# Patient Record
Sex: Female | Born: 1980 | Race: White | Hispanic: No | Marital: Single | Smoking: Never smoker
Health system: Southern US, Community
[De-identification: ages and names within clinical notes are randomized; demographics above are authoritative.]

## PROBLEM LIST (undated history)

## (undated) DIAGNOSIS — N809 Endometriosis, unspecified: Secondary | ICD-10-CM

## (undated) HISTORY — PX: WISDOM TOOTH EXTRACTION: SHX21

---
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
## 2003-09-17 ENCOUNTER — Emergency Department (HOSPITAL_COMMUNITY): Admission: AD | Admit: 2003-09-17 | Discharge: 2003-09-17 | Payer: Self-pay | Admitting: Family Medicine

## 2004-07-24 IMAGING — CR DG FOOT COMPLETE 3+V*L*
2 series · 2 of 2 positions shown · non-contrast
Comparison: none

CLINICAL DATA: Pain. 
 LEFT FOOT, THREE VIEWS ? 09/17/03 (9977 HOURS)
 There is no evidence of fracture or dislocation.  No other significant bone or soft tissue abnormalities are identified.  The joint spaces are within normal limits. 

 IMPRESSION
 Normal study.

[view not recorded (1 of 2)]
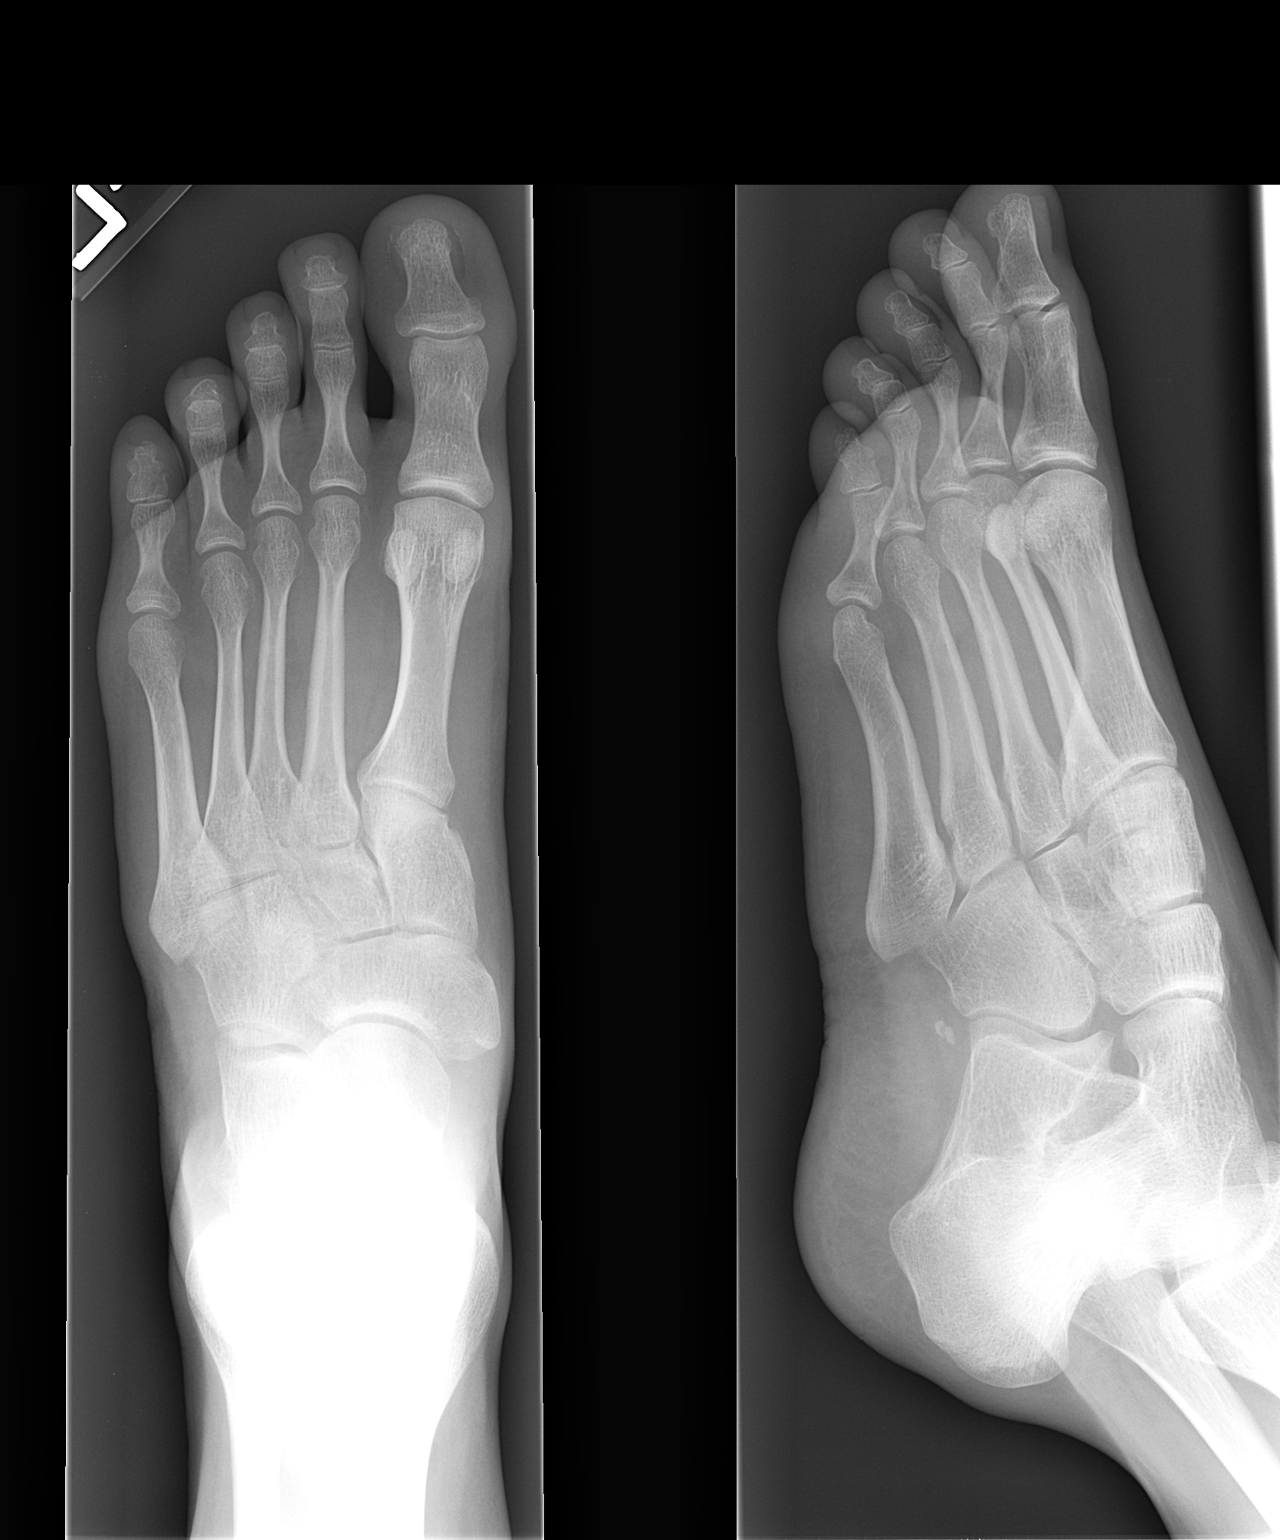

[view not recorded (2 of 2)]
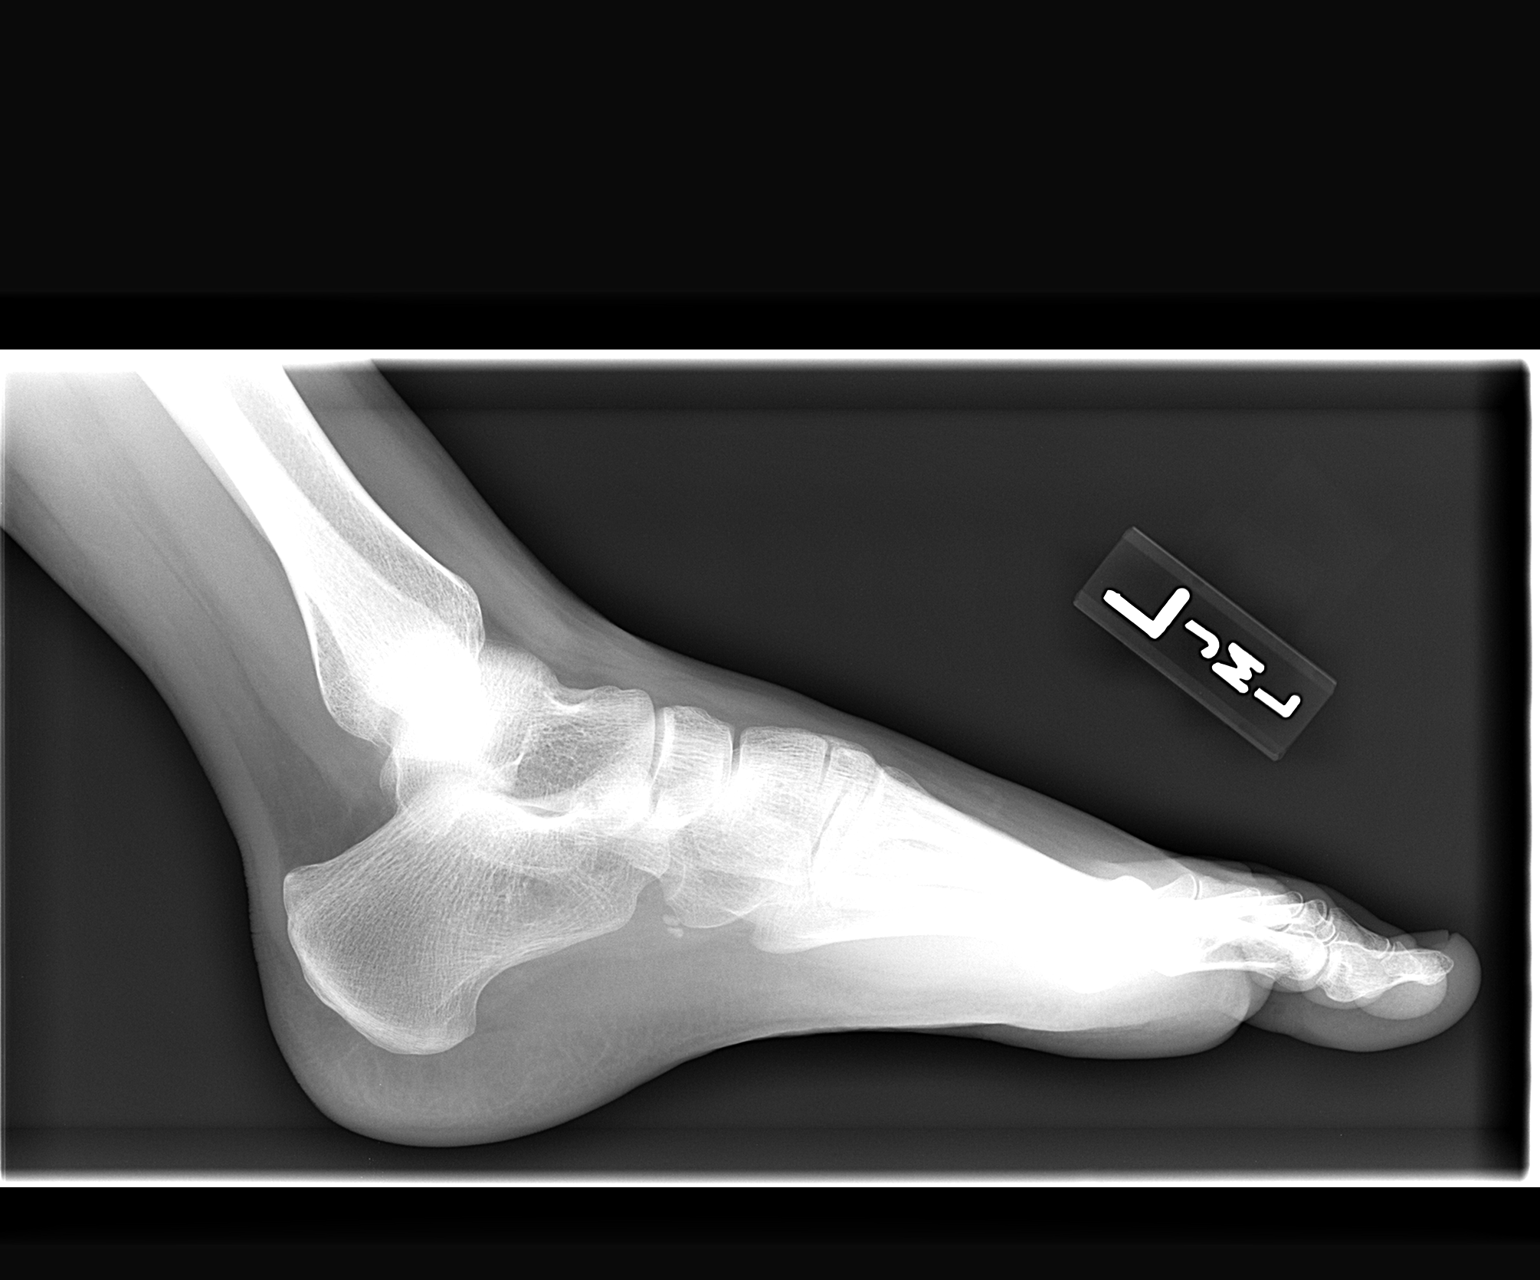

[2 of 2 positions shown; findings below may reference images not displayed]

## 2017-07-04 DIAGNOSIS — Z23 Encounter for immunization: Secondary | ICD-10-CM | POA: Diagnosis not present

## 2018-01-04 DIAGNOSIS — Z136 Encounter for screening for cardiovascular disorders: Secondary | ICD-10-CM | POA: Diagnosis not present

## 2018-01-04 DIAGNOSIS — Z0000 Encounter for general adult medical examination without abnormal findings: Secondary | ICD-10-CM | POA: Diagnosis not present

## 2018-01-04 DIAGNOSIS — R0981 Nasal congestion: Secondary | ICD-10-CM | POA: Diagnosis not present

## 2018-01-17 DIAGNOSIS — Z01419 Encounter for gynecological examination (general) (routine) without abnormal findings: Secondary | ICD-10-CM | POA: Diagnosis not present

## 2018-02-20 DIAGNOSIS — N83201 Unspecified ovarian cyst, right side: Secondary | ICD-10-CM | POA: Diagnosis not present

## 2018-02-20 DIAGNOSIS — N920 Excessive and frequent menstruation with regular cycle: Secondary | ICD-10-CM | POA: Diagnosis not present

## 2018-03-12 DIAGNOSIS — R638 Other symptoms and signs concerning food and fluid intake: Secondary | ICD-10-CM | POA: Diagnosis not present

## 2018-03-19 ENCOUNTER — Other Ambulatory Visit: Payer: Self-pay | Admitting: Obstetrics & Gynecology

## 2018-03-20 ENCOUNTER — Other Ambulatory Visit: Payer: Self-pay

## 2018-03-20 ENCOUNTER — Encounter (HOSPITAL_COMMUNITY): Payer: Self-pay | Admitting: *Deleted

## 2018-03-20 ENCOUNTER — Encounter (HOSPITAL_COMMUNITY)
Admission: RE | Admit: 2018-03-20 | Discharge: 2018-03-20 | Disposition: A | Discharge status: Home / Self Care | Payer: 59 | Source: Ambulatory Visit | Attending: Obstetrics & Gynecology | Admitting: Obstetrics & Gynecology

## 2018-03-20 DIAGNOSIS — N83201 Unspecified ovarian cyst, right side: Secondary | ICD-10-CM | POA: Diagnosis not present

## 2018-03-20 DIAGNOSIS — N83202 Unspecified ovarian cyst, left side: Secondary | ICD-10-CM | POA: Insufficient documentation

## 2018-03-20 DIAGNOSIS — N809 Endometriosis, unspecified: Secondary | ICD-10-CM | POA: Insufficient documentation

## 2018-03-20 DIAGNOSIS — Z01812 Encounter for preprocedural laboratory examination: Secondary | ICD-10-CM | POA: Diagnosis not present

## 2018-03-20 HISTORY — DX: Endometriosis, unspecified: N80.9

## 2018-03-20 LAB — CBC
HCT: 37.2 % (ref 36.0–46.0)
Hemoglobin: 11.2 g/dL — ABNORMAL LOW (ref 12.0–15.0)
MCH: 21.2 pg — ABNORMAL LOW (ref 26.0–34.0)
MCHC: 30.1 g/dL (ref 30.0–36.0)
MCV: 70.5 fL — ABNORMAL LOW (ref 78.0–100.0)
PLATELETS: 261 10*3/uL (ref 150–400)
RBC: 5.28 MIL/uL — ABNORMAL HIGH (ref 3.87–5.11)
RDW: 18.1 % — AB (ref 11.5–15.5)
WBC: 7.3 10*3/uL (ref 4.0–10.5)

## 2018-03-20 LAB — PREGNANCY, URINE: PREG TEST UR: NEGATIVE

## 2018-03-20 NOTE — Patient Instructions (Signed)
April Trujillo  03/20/2018   Your procedure is scheduled on: Thursday 03/28/2018  Report to [facility] Main  Entrance              Report to admitting at  0530 AM    Call this number if you have problems the morning of surgery [phone]    Remember: Do not eat food or drink liquids :After Midnight.   BRUSH YOUR TEETH MORNING OF SURGERY AND RINSE YOUR MOUTH OUT, NO CHEWING GUM CANDY OR MINTS.     Take these medicines the morning of surgery with A SIP OF WATER: none                                 You may not have any metal on your body including hair pins and              piercings  Do not wear jewelry, make-up, lotions, powders or perfumes, deodorant             Do not wear nail polish.  Do not shave  48 hours prior to surgery.               Do not bring valuables to the hospital. [facility] IS NOT             RESPONSIBLE   FOR VALUABLES.  Contacts, dentures or bridgework may not be worn into surgery.  Leave suitcase in the car. After surgery it may be brought to your room.     Patients discharged the day of surgery will not be allowed to drive home.  Name and phone number of your driver:               Please read over the following fact sheets you were given: _____________________________________________________________________             [facility] - Preparing for Surgery Before surgery, you can play an important role.  Because skin is not sterile, your skin needs to be as free of germs as possible.  You can reduce the number of germs on your skin by washing with CHG (chlorahexidine gluconate) soap before surgery.  CHG is an antiseptic cleaner which kills germs and bonds with the skin to continue killing germs even after washing. Please DO NOT use if you have an allergy to CHG or antibacterial soaps.  If your skin becomes reddened/irritated stop using the CHG and inform your nurse when you arrive at Short Stay. Do not shave (including legs  and underarms) for at least 48 hours prior to the first CHG shower.  You may shave your face/neck. Please follow these instructions carefully:  1.  Shower with CHG Soap the night before surgery and the  morning of Surgery.  2.  If you choose to wash your hair, wash your hair first as usual with your  normal  shampoo.  3.  After you shampoo, rinse your hair and body thoroughly to remove the  shampoo.                           4.  Use CHG as you would any other liquid soap.  You can apply chg directly  to the skin and wash  Gently with a scrungie or clean washcloth.  5.  Apply the CHG Soap to your body ONLY FROM THE NECK DOWN.   Do not use on face/ open                           Wound or open sores. Avoid contact with eyes, ears mouth and genitals (private parts).                       Wash face,  Genitals (private parts) with your normal soap.             6.  Wash thoroughly, paying special attention to the area where your surgery  will be performed.  7.  Thoroughly rinse your body with warm water from the neck down.  8.  DO NOT shower/wash with your normal soap after using and rinsing off  the CHG Soap.                9.  Pat yourself dry with a clean towel.            10.  Wear clean pajamas.            11.  Place clean sheets on your bed the night of your first shower and do not  sleep with pets. Day of Surgery : Do not apply any lotions/deodorants the morning of surgery.  Please wear clean clothes to the hospital/surgery center.  FAILURE TO FOLLOW THESE INSTRUCTIONS MAY RESULT IN THE CANCELLATION OF YOUR SURGERY PATIENT SIGNATURE_________________________________  NURSE SIGNATURE__________________________________  ________________________________________________________________________   Adam Phenix  An incentive spirometer is a tool that can help keep your lungs clear and active. This tool measures how well you are filling your lungs with each breath.  Taking long deep breaths may help reverse or decrease the chance of developing breathing (pulmonary) problems (especially infection) following:  A long period of time when you are unable to move or be active. BEFORE THE PROCEDURE   If the spirometer includes an indicator to show your best effort, your nurse or respiratory therapist will set it to a desired goal.  If possible, sit up straight or lean slightly forward. Try not to slouch.  Hold the incentive spirometer in an upright position. INSTRUCTIONS FOR USE  1. Sit on the edge of your bed if possible, or sit up as far as you can in bed or on a chair. 2. Hold the incentive spirometer in an upright position. 3. Breathe out normally. 4. Place the mouthpiece in your mouth and seal your lips tightly around it. 5. Breathe in slowly and as deeply as possible, raising the piston or the ball toward the top of the column. 6. Hold your breath for 3-5 seconds or for as long as possible. Allow the piston or ball to fall to the bottom of the column. 7. Remove the mouthpiece from your mouth and breathe out normally. 8. Rest for a few seconds and repeat Steps 1 through 7 at least 10 times every 1-2 hours when you are awake. Take your time and take a few normal breaths between deep breaths. 9. The spirometer may include an indicator to show your best effort. Use the indicator as a goal to work toward during each repetition. 10. After each set of 10 deep breaths, practice coughing to be sure your lungs are clear. If you have an incision (the cut made at the time of surgery),  support your incision when coughing by placing a pillow or rolled up towels firmly against it. Once you are able to get out of bed, walk around indoors and cough well. You may stop using the incentive spirometer when instructed by your caregiver.  RISKS AND COMPLICATIONS  Take your time so you do not get dizzy or light-headed.  If you are in pain, you may need to take or ask for pain  medication before doing incentive spirometry. It is harder to take a deep breath if you are having pain. AFTER USE  Rest and breathe slowly and easily.  It can be helpful to keep track of a log of your progress. Your caregiver can provide you with a simple table to help with this. If you are using the spirometer at home, follow these instructions: [place] IF:   You are having difficultly using the spirometer.  You have trouble using the spirometer as often as instructed.  Your pain medication is not giving enough relief while using the spirometer.  You develop fever of 100.5 F (38.1 C) or higher. SEEK IMMEDIATE MEDICAL CARE IF:   You cough up bloody sputum that had not been present before.  You develop fever of 102 F (38.9 C) or greater.  You develop worsening pain at or near the incision site. MAKE SURE YOU:   Understand these instructions.  Will watch your condition.  Will get help right away if you are not doing well or get worse. Document Released: 10/23/2006 Document Revised: 09/04/2011 Document Reviewed: 12/24/2006 [facility] Patient Information 2014 [place], [place].   ________________________________________________________________________

## 2018-03-28 ENCOUNTER — Encounter (HOSPITAL_COMMUNITY): Payer: Self-pay

## 2018-03-28 ENCOUNTER — Ambulatory Visit (HOSPITAL_COMMUNITY)
Admission: RE | Admit: 2018-03-28 | Discharge: 2018-03-28 | Disposition: A | Discharge status: Home / Self Care | Payer: 59 | Source: Ambulatory Visit | Attending: Obstetrics & Gynecology | Admitting: Obstetrics & Gynecology

## 2018-03-28 ENCOUNTER — Encounter (HOSPITAL_COMMUNITY)
Admission: RE | Disposition: A | Discharge status: Home / Self Care | Payer: Self-pay | Source: Ambulatory Visit | Attending: Obstetrics & Gynecology

## 2018-03-28 ENCOUNTER — Ambulatory Visit (HOSPITAL_COMMUNITY): Payer: 59 | Admitting: Anesthesiology

## 2018-03-28 DIAGNOSIS — N946 Dysmenorrhea, unspecified: Secondary | ICD-10-CM | POA: Insufficient documentation

## 2018-03-28 DIAGNOSIS — R102 Pelvic and perineal pain: Secondary | ICD-10-CM | POA: Diagnosis present

## 2018-03-28 DIAGNOSIS — N83202 Unspecified ovarian cyst, left side: Secondary | ICD-10-CM | POA: Diagnosis not present

## 2018-03-28 DIAGNOSIS — Z79899 Other long term (current) drug therapy: Secondary | ICD-10-CM | POA: Diagnosis not present

## 2018-03-28 DIAGNOSIS — Z793 Long term (current) use of hormonal contraceptives: Secondary | ICD-10-CM | POA: Insufficient documentation

## 2018-03-28 DIAGNOSIS — N83201 Unspecified ovarian cyst, right side: Secondary | ICD-10-CM | POA: Diagnosis not present

## 2018-03-28 DIAGNOSIS — N803 Endometriosis of pelvic peritoneum: Secondary | ICD-10-CM | POA: Diagnosis not present

## 2018-03-28 DIAGNOSIS — N83291 Other ovarian cyst, right side: Secondary | ICD-10-CM | POA: Diagnosis not present

## 2018-03-28 HISTORY — PX: ROBOTIC ASSISTED LAPAROSCOPIC OVARIAN CYSTECTOMY: SHX6081

## 2018-03-28 HISTORY — PX: ROBOTIC ASSISTED LAPAROSCOPIC LYSIS OF ADHESION: SHX6080

## 2018-03-28 HISTORY — PX: CHROMOPERTUBATION: SHX6288

## 2018-03-28 SURGERY — EXCISION, CYST, OVARY, ROBOT-ASSISTED, LAPAROSCOPIC
Anesthesia: General | Laterality: Bilateral

## 2018-03-28 MED ORDER — SODIUM CHLORIDE 0.9 % IV SOLN
INTRAVENOUS | Status: DC | PRN
  Administered 2018-03-28: 13 mL

## 2018-03-28 MED ORDER — KETOROLAC TROMETHAMINE 30 MG/ML IJ SOLN: 30.0000 mg | Freq: Once | INTRAMUSCULAR | Status: DC | PRN

## 2018-03-28 MED ORDER — GLYCOPYRROLATE PF 0.2 MG/ML IJ SOSY
PREFILLED_SYRINGE | INTRAMUSCULAR | Status: DC | PRN
  Administered 2018-03-28: .2 mg via INTRAVENOUS

## 2018-03-28 MED ORDER — ONDANSETRON HCL 4 MG/2ML IJ SOLN
INTRAMUSCULAR | Status: DC | PRN
  Administered 2018-03-28: 4 mg via INTRAVENOUS

## 2018-03-28 MED ORDER — FENTANYL CITRATE (PF) 100 MCG/2ML IJ SOLN
INTRAMUSCULAR | Status: DC | PRN
  Administered 2018-03-28: 50 ug via INTRAVENOUS
  Administered 2018-03-28: 100 ug via INTRAVENOUS

## 2018-03-28 MED ORDER — CEFAZOLIN SODIUM-DEXTROSE 2-4 GM/100ML-% IV SOLN
2.0000 g | INTRAVENOUS | Status: AC
  Administered 2018-03-28: 2 g via INTRAVENOUS
  Filled 2018-03-28: qty 100

## 2018-03-28 MED ORDER — LIDOCAINE 2% (20 MG/ML) 5 ML SYRINGE
INTRAMUSCULAR | Status: DC | PRN
  Administered 2018-03-28: 1.5 mg/kg/h via INTRAVENOUS

## 2018-03-28 MED ORDER — HYDROCODONE-ACETAMINOPHEN 7.5-325 MG PO TABS: 1.0000 | ORAL_TABLET | Freq: Four times a day (QID) | ORAL | Status: DC | PRN

## 2018-03-28 MED ORDER — ROPIVACAINE HCL 5 MG/ML IJ SOLN
INTRAMUSCULAR | Status: AC
  Filled 2018-03-28: qty 30

## 2018-03-28 MED ORDER — STERILE WATER FOR IRRIGATION IR SOLN
Status: DC | PRN
  Administered 2018-03-28: 1000 mL

## 2018-03-28 MED ORDER — KETOROLAC TROMETHAMINE 30 MG/ML IJ SOLN
INTRAMUSCULAR | Status: DC | PRN
  Administered 2018-03-28: 30 mg via INTRAVENOUS

## 2018-03-28 MED ORDER — MIDAZOLAM HCL 2 MG/2ML IJ SOLN
INTRAMUSCULAR | Status: DC | PRN
  Administered 2018-03-28: 2 mg via INTRAVENOUS

## 2018-03-28 MED ORDER — EPHEDRINE SULFATE-NACL 50-0.9 MG/10ML-% IV SOSY
PREFILLED_SYRINGE | INTRAVENOUS | Status: DC | PRN
  Administered 2018-03-28: 10 mg via INTRAVENOUS

## 2018-03-28 MED ORDER — DEXAMETHASONE SODIUM PHOSPHATE 10 MG/ML IJ SOLN
INTRAMUSCULAR | Status: DC | PRN
  Administered 2018-03-28: 10 mg via INTRAVENOUS

## 2018-03-28 MED ORDER — METHYLENE BLUE 0.5 % INJ SOLN
INTRAVENOUS | Status: AC
  Filled 2018-03-28: qty 10

## 2018-03-28 MED ORDER — SUGAMMADEX SODIUM 200 MG/2ML IV SOLN
INTRAVENOUS | Status: AC
  Filled 2018-03-28: qty 2

## 2018-03-28 MED ORDER — ROCURONIUM BROMIDE 10 MG/ML (PF) SYRINGE
PREFILLED_SYRINGE | INTRAVENOUS | Status: AC
  Filled 2018-03-28: qty 10

## 2018-03-28 MED ORDER — HYDROMORPHONE HCL 1 MG/ML IJ SOLN
INTRAMUSCULAR | Status: AC
  Filled 2018-03-28: qty 1

## 2018-03-28 MED ORDER — ACETAMINOPHEN 10 MG/ML IV SOLN
INTRAVENOUS | Status: DC | PRN
  Administered 2018-03-28: 1000 mg via INTRAVENOUS

## 2018-03-28 MED ORDER — SODIUM CHLORIDE 0.9 % IR SOLN
Status: DC | PRN
  Administered 2018-03-28: 3000 mL

## 2018-03-28 MED ORDER — FENTANYL CITRATE (PF) 250 MCG/5ML IJ SOLN
INTRAMUSCULAR | Status: AC
  Filled 2018-03-28: qty 5

## 2018-03-28 MED ORDER — METHYLENE BLUE 0.5 % INJ SOLN
INTRAVENOUS | Status: DC | PRN
  Administered 2018-03-28: 4 mL

## 2018-03-28 MED ORDER — DEXAMETHASONE SODIUM PHOSPHATE 10 MG/ML IJ SOLN
INTRAMUSCULAR | Status: AC
  Filled 2018-03-28: qty 1

## 2018-03-28 MED ORDER — KETAMINE HCL 10 MG/ML IJ SOLN
INTRAMUSCULAR | Status: AC
  Filled 2018-03-28: qty 1

## 2018-03-28 MED ORDER — HYDROCODONE-ACETAMINOPHEN 5-325 MG PO TABS: 1.0000 | ORAL_TABLET | Freq: Four times a day (QID) | ORAL | 0 refills | Status: AC | PRN

## 2018-03-28 MED ORDER — GLYCOPYRROLATE PF 0.2 MG/ML IJ SOSY
PREFILLED_SYRINGE | INTRAMUSCULAR | Status: AC
  Filled 2018-03-28: qty 1

## 2018-03-28 MED ORDER — ONDANSETRON HCL 4 MG/2ML IJ SOLN
INTRAMUSCULAR | Status: AC
  Filled 2018-03-28: qty 2

## 2018-03-28 MED ORDER — LIDOCAINE 2% (20 MG/ML) 5 ML SYRINGE
INTRAMUSCULAR | Status: DC | PRN
  Administered 2018-03-28: 40 mg via INTRAVENOUS

## 2018-03-28 MED ORDER — PROMETHAZINE HCL 25 MG/ML IJ SOLN: 6.2500 mg | INTRAMUSCULAR | Status: DC | PRN

## 2018-03-28 MED ORDER — KETAMINE HCL 10 MG/ML IJ SOLN
INTRAMUSCULAR | Status: DC | PRN
  Administered 2018-03-28 (×2): 15 mg via INTRAVENOUS

## 2018-03-28 MED ORDER — IBUPROFEN 200 MG PO TABS: 600.0000 mg | ORAL_TABLET | Freq: Four times a day (QID) | ORAL | 0 refills | Status: AC | PRN

## 2018-03-28 MED ORDER — PROPOFOL 10 MG/ML IV BOLUS
INTRAVENOUS | Status: AC
  Filled 2018-03-28: qty 40

## 2018-03-28 MED ORDER — HYDROMORPHONE HCL 1 MG/ML IJ SOLN
0.2500 mg | INTRAMUSCULAR | Status: DC | PRN
  Administered 2018-03-28: 0.5 mg via INTRAVENOUS
  Administered 2018-03-28: 0.25 mg via INTRAVENOUS
  Administered 2018-03-28: 0.5 mg via INTRAVENOUS
  Administered 2018-03-28: 0.25 mg via INTRAVENOUS

## 2018-03-28 MED ORDER — SCOPOLAMINE 1 MG/3DAYS TD PT72
MEDICATED_PATCH | TRANSDERMAL | Status: AC
  Filled 2018-03-28: qty 1

## 2018-03-28 MED ORDER — ACETAMINOPHEN 10 MG/ML IV SOLN
INTRAVENOUS | Status: AC
  Filled 2018-03-28: qty 100

## 2018-03-28 MED ORDER — LACTATED RINGERS IV SOLN
INTRAVENOUS | Status: DC
  Administered 2018-03-28: 06:00:00 via INTRAVENOUS

## 2018-03-28 MED ORDER — EPHEDRINE 5 MG/ML INJ
INTRAVENOUS | Status: AC
  Filled 2018-03-28: qty 10

## 2018-03-28 MED ORDER — MIDAZOLAM HCL 2 MG/2ML IJ SOLN
INTRAMUSCULAR | Status: AC
  Filled 2018-03-28: qty 2

## 2018-03-28 MED ORDER — LIDOCAINE 2% (20 MG/ML) 5 ML SYRINGE
INTRAMUSCULAR | Status: AC
  Filled 2018-03-28: qty 5

## 2018-03-28 MED ORDER — SUGAMMADEX SODIUM 200 MG/2ML IV SOLN
INTRAVENOUS | Status: DC | PRN
  Administered 2018-03-28: 200 mg via INTRAVENOUS

## 2018-03-28 MED ORDER — 0.9 % SODIUM CHLORIDE (POUR BTL) OPTIME
TOPICAL | Status: DC | PRN
  Administered 2018-03-28: 1000 mL

## 2018-03-28 MED ORDER — SCOPOLAMINE 1 MG/3DAYS TD PT72
MEDICATED_PATCH | TRANSDERMAL | Status: DC | PRN
  Administered 2018-03-28: 1 via TRANSDERMAL

## 2018-03-28 MED ORDER — PROPOFOL 10 MG/ML IV BOLUS
INTRAVENOUS | Status: DC | PRN
  Administered 2018-03-28: 150 mg via INTRAVENOUS

## 2018-03-28 MED ORDER — ROCURONIUM BROMIDE 10 MG/ML (PF) SYRINGE
PREFILLED_SYRINGE | INTRAVENOUS | Status: DC | PRN
  Administered 2018-03-28: 70 mg via INTRAVENOUS

## 2018-03-28 MED ORDER — ARTIFICIAL TEARS OPHTHALMIC OINT
TOPICAL_OINTMENT | OPHTHALMIC | Status: AC
  Filled 2018-03-28: qty 3.5

## 2018-03-28 MED ORDER — KETOROLAC TROMETHAMINE 30 MG/ML IJ SOLN
INTRAMUSCULAR | Status: AC
  Filled 2018-03-28: qty 1

## 2018-03-28 MED ORDER — SODIUM CHLORIDE 0.9 % IJ SOLN
INTRAMUSCULAR | Status: AC
  Filled 2018-03-28: qty 50

## 2018-03-28 SURGICAL SUPPLY — 48 items
BARRIER ADHS 3X4 INTERCEED (GAUZE/BANDAGES/DRESSINGS) ×6 IMPLANT
CATH FOLEY 3WAY  5CC 16FR (CATHETERS) ×1
CATH FOLEY 3WAY 5CC 16FR (CATHETERS) ×2 IMPLANT
COVER BACK TABLE 60X90IN (DRAPES) ×3 IMPLANT
COVER TIP SHEARS 8 DVNC (MISCELLANEOUS) ×2 IMPLANT
COVER TIP SHEARS 8MM DA VINCI (MISCELLANEOUS) ×1
DECANTER SPIKE VIAL GLASS SM (MISCELLANEOUS) ×6 IMPLANT
DEFOGGER SCOPE WARMER CLEARIFY (MISCELLANEOUS) ×3 IMPLANT
DERMABOND ADVANCED (GAUZE/BANDAGES/DRESSINGS) ×1
DERMABOND ADVANCED .7 DNX12 (GAUZE/BANDAGES/DRESSINGS) ×2 IMPLANT
DRAPE ARM DVNC X/XI (DISPOSABLE) ×8 IMPLANT
DRAPE COLUMN DVNC XI (DISPOSABLE) ×2 IMPLANT
DRAPE DA VINCI XI ARM (DISPOSABLE) ×4
DRAPE DA VINCI XI COLUMN (DISPOSABLE) ×1
DURAPREP 26ML APPLICATOR (WOUND CARE) ×3 IMPLANT
ELECT REM PT RETURN 15FT ADLT (MISCELLANEOUS) ×3 IMPLANT
GAUZE SPONGE 4X4 16PLY XRAY LF (GAUZE/BANDAGES/DRESSINGS) ×3 IMPLANT
GLOVE BIO SURGEON STRL SZ7 (GLOVE) ×9 IMPLANT
GLOVE BIOGEL PI IND STRL 7.0 (GLOVE) ×10 IMPLANT
GLOVE BIOGEL PI INDICATOR 7.0 (GLOVE) ×5
IRRIG SUCT STRYKERFLOW 2 WTIP (MISCELLANEOUS) ×3
IRRIGATION SUCT STRKRFLW 2 WTP (MISCELLANEOUS) ×2 IMPLANT
MANIPULATOR UTERINE 4.5 ZUMI (MISCELLANEOUS) ×3 IMPLANT
OCCLUDER COLPOPNEUMO (BALLOONS) ×3 IMPLANT
PACK ROBOT WH (CUSTOM PROCEDURE TRAY) ×3 IMPLANT
PACK ROBOTIC GOWN (GOWN DISPOSABLE) ×3 IMPLANT
PACK TRENDGUARD 450 HYBRID PRO (MISCELLANEOUS) IMPLANT
PAD PREP 24X48 CUFFED NSTRL (MISCELLANEOUS) ×3 IMPLANT
POSITIONER SURGICAL ARM (MISCELLANEOUS) ×9 IMPLANT
SEAL CANN UNIV 5-8 DVNC XI (MISCELLANEOUS) ×6 IMPLANT
SEAL XI 5MM-8MM UNIVERSAL (MISCELLANEOUS) ×3
SEALER VESSEL DA VINCI XI (MISCELLANEOUS)
SEALER VESSEL EXT DVNC XI (MISCELLANEOUS) IMPLANT
SET CYSTO W/LG BORE CLAMP LF (SET/KITS/TRAYS/PACK) IMPLANT
SET TRI-LUMEN FLTR TB AIRSEAL (TUBING) ×3 IMPLANT
SUT VICRYL 0 UR6 27IN ABS (SUTURE) ×3 IMPLANT
SUT VICRYL 4-0 PS2 18IN ABS (SUTURE) ×6 IMPLANT
SUT VLOC 180 0 9IN  GS21 (SUTURE) ×1
SUT VLOC 180 0 9IN GS21 (SUTURE) ×2 IMPLANT
TIP RUMI ORANGE 6.7MMX12CM (TIP) IMPLANT
TIP UTERINE 5.1X6CM LAV DISP (MISCELLANEOUS) IMPLANT
TIP UTERINE 6.7X10CM GRN DISP (MISCELLANEOUS) IMPLANT
TIP UTERINE 6.7X6CM WHT DISP (MISCELLANEOUS) IMPLANT
TIP UTERINE 6.7X8CM BLUE DISP (MISCELLANEOUS) IMPLANT
TOWEL OR 17X26 10 PK STRL BLUE (TOWEL DISPOSABLE) ×3 IMPLANT
TRENDGUARD 450 HYBRID PRO PACK (MISCELLANEOUS)
TROCAR PORT AIRSEAL 5X120 (TROCAR) ×3 IMPLANT
WATER STERILE IRR 1000ML POUR (IV SOLUTION) ×3 IMPLANT

## 2018-03-28 NOTE — Op Note (Signed)
Preoperative diagnosis: Pelvic pain, dysmenorrhea, bilateral ovarian cysts  Postoperative diagnosis: Severe endometriosis stage IV. Bilateral endometriomas, Right tube with delayed spill, left tube proximal blockage.  Procedure: Operative Laparoscopy, bilateral ovarian cystectomy, lysis of adhesions, ablation of endometriosis, chromopertubation Surgeon: April Evans, MD Assistants: April Fordyce, MD Anesthesia Gen. Endotracheal IV fluids LR EBL 30 cc  Urine clear in foley 50 cc Complications  none Disposition  PACU and home Specimens:  ovarian cyst wall.   Procedure Patient presents for Operative laparoscopy.   Risk and complications of surgery including infection, bleeding, damage to internal organs, other complications including pneumonia, VTE were reviewed.  Patient voiced understanding. Informed written consent was obtained and patient was brought to the operating room with IV running. 2 gm Ancef given. Timeout was carried out.  She underwent general anesthesia without difficulty and was given dorsal lithotomy position. Pelvic examination under anesthesia was normal. She was prepped and draped in standard fashion. Foley placed in bladder. Hulka manipulator was inserted in uterus and secured to cervix. Gloves gown were changed attention was focused on the abdomen.   A  8 mm curved incision was made at the lower edge of the umbilicus. Incision was carried down to the fascia was incised. Peritoneal entry made. Stay sutures with 0 Vicryl placed on fascia. Robot trocar inserted. Insufflation was begun with CO2 and a 0 da vinci laparoscope was introduced. No bleeding noted at entry site. Robot ports marked and trocars inserted under vision, assist port/ air seal trocar inserted in left upper quadrant. Air seal turned on.  Pelvic endometriosis noted. Robot was docked from the right. Dr April Trujillo went to console and Dr April Trujillo remained at bedside.   Bilateral ovaries were adherent to ovarian fossa,  right tube appeared normal, left was swollen thick. Uterus was retroverted, retroflexed due to posterior fundal adhesions to cul de sac. Sharp dissection performed with minimal cautery to control bleeding. Left ovary and tube were dissected off from adhesions and endometrioma fluid released. Endometrioma cyst wall was not seen well. Right ovary and tube dissection performed to release from adhesions. Uterine adhesions released carefully from cul de sac. Bilateral ureters were seen peristalsing well. Chromopertubation noted right tube with delayed spill and left tube noted proximal blockage. Endometriotic implants ablation done.  Interceed placed in ovarian dissection area and in cul de sac behind the uterus.   Instruments and trocars removed after instilling Ropivacaine. Pneumoperitoneum was released. Fascial incision closed with single stitch of 0-Vicryl. The skin of all incisions approximated using 4-0 Vicryl in subcuticular fashion. Dermabond was applied at the incision. The uterine manipulator was removed, foley removed. Hemostasis was excellent.  All counts were correct x 2. Patient brought out to the recovery room after extubation in stable condition.  Plan is to discharge home from recovery room.  Surgical findings were discussed with patient's family.  Followup with Dr. Juliene Trujillo in office in 2 weeks.  I performed the surgery.  April Hertz, MD

## 2018-03-28 NOTE — H&P (Signed)
April Trujillo is an 37 y.o. female G0. Here for bilateral ovarian cysts laparoscopic removal.  Healthy female. Presented with menorrhagia and worsening dysmenorrhrea.  Office sono noted bilateral echogenic ovarian cysts and was counseled for surgery due to being solid/ complex and the size.  CA125 was slightly elevated at 47.7  AFP tumor marker was normal.   Nl Paps, sexually active, monogamous. No breast complaints. BC- OCs   Patient's last menstrual period was 01/28/2018 (within days).    Past Medical History:  Diagnosis Date  . Endometriosis     Past Surgical History:  Procedure Laterality Date  . WISDOM TOOTH EXTRACTION      History reviewed. No pertinent family history.  Social History:  reports that she has never smoked. She has never used smokeless tobacco. She reports that she drinks alcohol. Her drug history is not on file.  Allergies: No Known Allergies  Medications Prior to Admission  Medication Sig Dispense Refill Last Dose  . amphetamine-dextroamphetamine (ADDERALL XR) 20 MG 24 hr capsule Take 20 mg by mouth daily.   03/27/2018 at Unknown time  . Aspirin-Salicylamide-Caffeine (BC HEADACHE POWDER PO) Take 1 packet by mouth 3 (three) times daily as needed (back aches/pain.).   Past Week at Unknown time  . norethindrone-ethinyl estradiol (JUNEL FE,GILDESS FE,LOESTRIN FE) 1-20 MG-MCG tablet Take 1 tablet by mouth daily.   03/27/2018 at Unknown time    ROS neg  Blood pressure 106/71, pulse 69, temperature 98.2 F (36.8 C), temperature source Oral, resp. rate 16, height 5\' 5"  (1.651 m), weight 56.9 kg, last menstrual period 01/28/2018, SpO2 100 %. Physical Exam Physical exam:  A&O x 3, no acute distress. Pleasant HEENT neg, no thyromegaly Lungs CTA bilat CV RRR, S1S2 normal Abdo soft, non tender, non acute Extr no edema/ tenderness Pelvic bilateral adnexal masses, retroverted uterus   Office sono-  Uterus retroverted, retroflexed.  Right Ovary- 8 x6.6x 4 cm  with 2 cysts 4.8x4.4 cm and 3.5x3 cm, complex with a solid component Left Ovary -5.7x 4.6x 4 cm and single complex cyst with solid component 3.8x3.4 cm   Assessment/Plan: Bilateral complex ovarian cysts, dysmenorrhea and menorrhagia, slightly elevated Ca125, suspect endometriosis or may be possible dermoids.   Here for davinci assisted laparoscopic bilateral ovarian cystectomy, pelvic washings.   Risks/complications of surgery reviewed incl infection, bleeding, damage to internal organs including bladder, bowels, ureters, blood vessels, other risks from anesthesia, VTE and delayed complications of any surgery, complications in future surgery reviewed.   Robley Fries 03/28/2018, 6:53 AM

## 2018-03-28 NOTE — Anesthesia Postprocedure Evaluation (Signed)
Anesthesia Post Note  Patient: Sela Hilding  Procedure(s) Performed: XI ROBOTIC ASSISTED LAPAROSCOPIC OVARIAN CYSTECTOMY/Fulguration of Endometriosis (Bilateral ) CHROMOPERTUBATION (Bilateral ) XI ROBOTIC ASSISTED LAPAROSCOPIC LYSIS OF ADHESION     Patient location during evaluation: PACU Anesthesia Type: General Level of consciousness: awake and alert Pain management: pain level controlled Vital Signs Assessment: post-procedure vital signs reviewed and stable Respiratory status: spontaneous breathing, nonlabored ventilation, respiratory function stable and patient connected to nasal cannula oxygen Cardiovascular status: blood pressure returned to baseline and stable Postop Assessment: no apparent nausea or vomiting Anesthetic complications: no    Last Vitals:  Vitals:   03/28/18 1100 03/28/18 1115  BP: 96/60 103/67  Pulse: 80 70  Resp: 17 16  Temp:  36.8 C  SpO2: 99% 97%    Last Pain:  Vitals:   03/28/18 1115  TempSrc:   PainSc: 4                  Quinnlyn Hearns S

## 2018-03-28 NOTE — Discharge Instructions (Signed)
Diagnostic Laparoscopy, Care After  Refer to this sheet in the next few weeks. These instructions provide you with information about caring for yourself after your procedure. Your health care provider may also give you more specific instructions. Your treatment has been planned according to current medical practices, but problems sometimes occur. Call your health care provider if you have any problems or questions after your procedure.  What can I expect after the procedure?  After your procedure, it is common to have mild discomfort in the throat and abdomen.  Follow these instructions at home:  · Take over-the-counter and prescription medicines only as told by your health care provider.  · Do not drive for 24 hours if you received a sedative.  · Return to your normal activities as told by your health care provider.  · Do not take baths, swim, or use a hot tub until your health care provider approves. You may shower.  · Follow instructions from your health care provider about how to take care of your incision. Make sure you:  ? Wash your hands with soap and water before you change your bandage (dressing). If soap and water are not available, use hand sanitizer.  ? Change your dressing as told by your health care provider.  ? Leave stitches (sutures), skin glue, or adhesive strips in place. These skin closures may need to stay in place for 2 weeks or longer. If adhesive strip edges start to loosen and curl up, you may trim the loose edges. Do not remove adhesive strips completely unless your health care provider tells you to do that.  · Check your incision area every day for signs of infection. Check for:  ? More redness, swelling, or pain.  ? More fluid or blood.  ? Warmth.  ? Pus or a bad smell.  · It is your responsibility to get the results of your procedure. Ask your health care provider or the department performing the procedure when your results will be ready.  Contact a health care provider if:  · There is  new pain in your shoulders.  · You feel light-headed or faint.  · You are unable to pass gas or unable to have a bowel movement.  · You feel nauseous or you vomit.  · You develop a rash.  · You have more redness, swelling, or pain around your incision.  · You have more fluid or blood coming from your incision.  · Your incision feels warm to the touch.  · You have pus or a bad smell coming from your incision.  · You have a fever or chills.  Get help right away if:  · Your pain is getting worse.  · You have ongoing vomiting.  · The edges of your incision open up.  · You have trouble breathing.  · You have chest pain.  This information is not intended to replace advice given to you by your health care provider. Make sure you discuss any questions you have with your health care provider.  Document Released: 05/24/2015 Document Revised: 11/18/2015 Document Reviewed: 02/23/2015  Elsevier Interactive Patient Education © 2018 [employer]

## 2018-03-28 NOTE — Anesthesia Procedure Notes (Signed)
Procedure Name: Intubation Date/Time: 03/28/2018 7:40 AM Performed by: Wanita Chamberlain, CRNA Pre-anesthesia Checklist: Timeout performed, Patient being monitored, Suction available, Emergency Drugs available and Patient identified Patient Re-evaluated:Patient Re-evaluated prior to induction Oxygen Delivery Method: Circle system utilized Preoxygenation: Pre-oxygenation with 100% oxygen Induction Type: IV induction Ventilation: Mask ventilation without difficulty Laryngoscope Size: Mac and 3 Grade View: Grade I Tube type: Oral Tube size: 7.0 mm Number of attempts: 1 Airway Equipment and Method: Stylet Placement Confirmation: breath sounds checked- equal and bilateral,  CO2 detector,  positive ETCO2 and ETT inserted through vocal cords under direct vision Secured at: 21 cm Tube secured with: Tape Dental Injury: Teeth and Oropharynx as per pre-operative assessment

## 2018-03-28 NOTE — Transfer of Care (Signed)
Immediate Anesthesia Transfer of Care Note  Patient: April Trujillo  Procedure(s) Performed: XI ROBOTIC ASSISTED LAPAROSCOPIC OVARIAN CYSTECTOMY/Fulguration of Endometriosis (Bilateral ) CHROMOPERTUBATION (Bilateral ) XI ROBOTIC ASSISTED LAPAROSCOPIC LYSIS OF ADHESION  Patient Location: PACU  Anesthesia Type:General  Level of Consciousness: drowsy and patient cooperative  Airway & Oxygen Therapy: Patient Spontanous Breathing and Patient connected to face mask  Post-op Assessment: Report given to RN and Post -op Vital signs reviewed and stable  Post vital signs: Reviewed and stable  Last Vitals:  Vitals Value Taken Time  BP 106/57 03/28/2018  9:47 AM  Temp    Pulse 82 03/28/2018  9:48 AM  Resp 17 03/28/2018  9:48 AM  SpO2 100 % 03/28/2018  9:48 AM  Vitals shown include unvalidated device data.  Last Pain:  Vitals:   03/28/18 0605  TempSrc:   PainSc: 0-No pain         Complications: No apparent anesthesia complications

## 2018-03-28 NOTE — Anesthesia Preprocedure Evaluation (Addendum)
Anesthesia Evaluation  Patient identified by MRN, date of birth, ID band Patient awake    Reviewed: Allergy & Precautions, NPO status , Patient's Chart, lab work & pertinent test results  Airway Mallampati: II  TM Distance: >3 FB Neck ROM: Full    Dental no notable dental hx. (+) Teeth Intact, Dental Advisory Given   Pulmonary neg pulmonary ROS,    Pulmonary exam normal breath sounds clear to auscultation       Cardiovascular negative cardio ROS Normal cardiovascular exam Rhythm:Regular Rate:Normal     Neuro/Psych negative neurological ROS  negative psych ROS   GI/Hepatic negative GI ROS, Neg liver ROS,   Endo/Other  negative endocrine ROS  Renal/GU negative Renal ROS  negative genitourinary   Musculoskeletal negative musculoskeletal ROS (+)   Abdominal   Peds negative pediatric ROS (+)  Hematology  (+) anemia ,   Anesthesia Other Findings   Reproductive/Obstetrics negative OB ROS                            Anesthesia Physical Anesthesia Plan  ASA: II  Anesthesia Plan: General   Post-op Pain Management:    Induction: Intravenous  PONV Risk Score and Plan: 3 and Ondansetron, Dexamethasone, Treatment may vary due to age or medical condition and Scopolamine patch - Pre-op  Airway Management Planned: Oral ETT  Additional Equipment:   Intra-op Plan:   Post-operative Plan: Extubation in OR  Informed Consent: I have reviewed the patients History and Physical, chart, labs and discussed the procedure including the risks, benefits and alternatives for the proposed anesthesia with the patient or authorized representative who has indicated his/her understanding and acceptance.   Dental advisory given  Plan Discussed with: CRNA and Surgeon  Anesthesia Plan Comments:         Anesthesia Quick Evaluation

## 2018-03-29 ENCOUNTER — Encounter (HOSPITAL_COMMUNITY): Payer: Self-pay | Admitting: Obstetrics & Gynecology

## 2018-04-12 DIAGNOSIS — N809 Endometriosis, unspecified: Secondary | ICD-10-CM | POA: Diagnosis not present

## 2018-07-08 DIAGNOSIS — Z23 Encounter for immunization: Secondary | ICD-10-CM | POA: Diagnosis not present

## 2022-08-08 DIAGNOSIS — F900 Attention-deficit hyperactivity disorder, predominantly inattentive type: Secondary | ICD-10-CM | POA: Diagnosis not present

## 2022-08-08 DIAGNOSIS — L814 Other melanin hyperpigmentation: Secondary | ICD-10-CM | POA: Diagnosis not present

## 2023-02-01 DIAGNOSIS — F900 Attention-deficit hyperactivity disorder, predominantly inattentive type: Secondary | ICD-10-CM | POA: Diagnosis not present

## 2023-02-01 DIAGNOSIS — E7800 Pure hypercholesterolemia, unspecified: Secondary | ICD-10-CM | POA: Diagnosis not present

## 2023-02-01 DIAGNOSIS — Z131 Encounter for screening for diabetes mellitus: Secondary | ICD-10-CM | POA: Diagnosis not present

## 2023-02-01 DIAGNOSIS — L709 Acne, unspecified: Secondary | ICD-10-CM | POA: Diagnosis not present

## 2023-02-01 DIAGNOSIS — Z0000 Encounter for general adult medical examination without abnormal findings: Secondary | ICD-10-CM | POA: Diagnosis not present

## 2023-04-19 ENCOUNTER — Other Ambulatory Visit: Payer: Self-pay

## 2023-04-19 MED ORDER — AMPHETAMINE-DEXTROAMPHET ER 20 MG PO CP24
20.0000 mg | ORAL_CAPSULE | Freq: Every morning | ORAL | 0 refills | Status: AC
  Filled 2023-04-19: qty 90, 90d supply, fill #0

## 2023-04-20 ENCOUNTER — Other Ambulatory Visit: Payer: Self-pay

## 2023-08-09 DIAGNOSIS — L709 Acne, unspecified: Secondary | ICD-10-CM | POA: Diagnosis not present

## 2023-08-09 DIAGNOSIS — F900 Attention-deficit hyperactivity disorder, predominantly inattentive type: Secondary | ICD-10-CM | POA: Diagnosis not present

## 2023-10-03 DIAGNOSIS — Z01419 Encounter for gynecological examination (general) (routine) without abnormal findings: Secondary | ICD-10-CM | POA: Diagnosis not present

## 2023-10-03 DIAGNOSIS — Z1331 Encounter for screening for depression: Secondary | ICD-10-CM | POA: Diagnosis not present

## 2023-10-03 DIAGNOSIS — Z1231 Encounter for screening mammogram for malignant neoplasm of breast: Secondary | ICD-10-CM | POA: Diagnosis not present

## 2024-02-07 DIAGNOSIS — F900 Attention-deficit hyperactivity disorder, predominantly inattentive type: Secondary | ICD-10-CM | POA: Diagnosis not present

## 2024-02-07 DIAGNOSIS — Z0000 Encounter for general adult medical examination without abnormal findings: Secondary | ICD-10-CM | POA: Diagnosis not present

## 2024-02-07 DIAGNOSIS — E7800 Pure hypercholesterolemia, unspecified: Secondary | ICD-10-CM | POA: Diagnosis not present

## 2024-02-07 DIAGNOSIS — Z131 Encounter for screening for diabetes mellitus: Secondary | ICD-10-CM | POA: Diagnosis not present

## 2024-04-21 ENCOUNTER — Other Ambulatory Visit: Payer: Self-pay

## 2024-04-21 MED ORDER — AMPHETAMINE-DEXTROAMPHET ER 20 MG PO CP24
20.0000 mg | ORAL_CAPSULE | Freq: Every morning | ORAL | 0 refills | Status: DC
  Filled 2024-04-21: qty 90, 90d supply, fill #0

## 2024-07-10 ENCOUNTER — Other Ambulatory Visit: Payer: Self-pay

## 2024-07-10 MED ORDER — AMPHETAMINE-DEXTROAMPHET ER 20 MG PO CP24
20.0000 mg | ORAL_CAPSULE | Freq: Every morning | ORAL | 0 refills | Status: AC
  Filled 2024-07-10: qty 90, 90d supply, fill #0
  Filled 2024-07-15: qty 30, 30d supply, fill #0

## 2024-07-15 ENCOUNTER — Other Ambulatory Visit: Payer: Self-pay

## 2024-07-16 ENCOUNTER — Other Ambulatory Visit: Payer: Self-pay
